# Patient Record
Sex: Male | Born: 1980 | Race: White | Hispanic: No | Marital: Single | Smoking: Current every day smoker
Health system: Southern US, Academic
[De-identification: ages and names within clinical notes are randomized; demographics above are authoritative.]

## PROBLEM LIST (undated history)

## (undated) DIAGNOSIS — F4310 Post-traumatic stress disorder, unspecified: Secondary | ICD-10-CM

## (undated) DIAGNOSIS — J45909 Unspecified asthma, uncomplicated: Secondary | ICD-10-CM

## (undated) DIAGNOSIS — F419 Anxiety disorder, unspecified: Secondary | ICD-10-CM

---

## 2020-01-02 ENCOUNTER — Encounter (HOSPITAL_COMMUNITY): Payer: Self-pay | Admitting: Emergency Medicine

## 2020-01-02 ENCOUNTER — Other Ambulatory Visit: Payer: Self-pay

## 2020-01-02 DIAGNOSIS — R1013 Epigastric pain: Secondary | ICD-10-CM | POA: Insufficient documentation

## 2020-01-02 DIAGNOSIS — J45909 Unspecified asthma, uncomplicated: Secondary | ICD-10-CM | POA: Insufficient documentation

## 2020-01-02 DIAGNOSIS — F419 Anxiety disorder, unspecified: Secondary | ICD-10-CM | POA: Diagnosis not present

## 2020-01-02 DIAGNOSIS — R0981 Nasal congestion: Secondary | ICD-10-CM | POA: Diagnosis not present

## 2020-01-02 DIAGNOSIS — R1011 Right upper quadrant pain: Secondary | ICD-10-CM | POA: Diagnosis not present

## 2020-01-02 DIAGNOSIS — R0602 Shortness of breath: Secondary | ICD-10-CM | POA: Insufficient documentation

## 2020-01-02 DIAGNOSIS — F410 Panic disorder [episodic paroxysmal anxiety] without agoraphobia: Secondary | ICD-10-CM | POA: Diagnosis present

## 2020-01-02 DIAGNOSIS — R55 Syncope and collapse: Secondary | ICD-10-CM | POA: Insufficient documentation

## 2020-01-02 DIAGNOSIS — R0789 Other chest pain: Secondary | ICD-10-CM | POA: Insufficient documentation

## 2020-01-02 NOTE — ED Triage Notes (Signed)
Pt states that he had a panic attack while in the dorm and thinks he may have passed out. Pt states he has history of the same. Pt has several stressors at this time, [facility] sent him to get something prescribed for his anxiety.

## 2020-01-03 ENCOUNTER — Emergency Department (HOSPITAL_COMMUNITY)
Admission: EM | Admit: 2020-01-03 | Discharge: 2020-01-03 | Disposition: A | Discharge status: Home / Self Care | Attending: Emergency Medicine | Admitting: Emergency Medicine

## 2020-01-03 ENCOUNTER — Emergency Department (HOSPITAL_COMMUNITY)

## 2020-01-03 DIAGNOSIS — F419 Anxiety disorder, unspecified: Secondary | ICD-10-CM

## 2020-01-03 DIAGNOSIS — R55 Syncope and collapse: Secondary | ICD-10-CM

## 2020-01-03 HISTORY — DX: Post-traumatic stress disorder, unspecified: F43.10

## 2020-01-03 HISTORY — DX: Anxiety disorder, unspecified: F41.9

## 2020-01-03 HISTORY — DX: Unspecified asthma, uncomplicated: J45.909

## 2020-01-03 LAB — TROPONIN I (HIGH SENSITIVITY)
Troponin I (High Sensitivity): 9 ng/L (ref <18)
Troponin I (High Sensitivity): 9 ng/L (ref <18)

## 2020-01-03 LAB — COMPREHENSIVE METABOLIC PANEL
ALT: 28 U/L (ref 0–44)
AST: 25 U/L (ref 15–41)
Albumin: 4.9 g/dL (ref 3.5–5.0)
Alkaline Phosphatase: 54 U/L (ref 38–126)
Anion gap: 12 (ref 5–15)
BUN: 17 mg/dL (ref 6–20)
CO2: 26 mmol/L (ref 22–32)
Calcium: 9.9 mg/dL (ref 8.9–10.3)
Chloride: 103 mmol/L (ref 98–111)
Creatinine, Ser: 0.79 mg/dL (ref 0.61–1.24)
GFR calc Af Amer: >60 mL/min (ref >60)
GFR calc non Af Amer: >60 mL/min (ref >60)
Glucose, Bld: 99 mg/dL (ref 70–99)
Potassium: 4.1 mmol/L (ref 3.5–5.1)
Sodium: 141 mmol/L (ref 135–145)
Total Bilirubin: 0.6 mg/dL (ref 0.3–1.2)
Total Protein: 8.6 g/dL — ABNORMAL HIGH (ref 6.5–8.1)

## 2020-01-03 LAB — CBC WITH DIFFERENTIAL/PLATELET
Abs Immature Granulocytes: 0.04 10*3/uL (ref 0.00–0.07)
Basophils Absolute: 0 10*3/uL (ref 0.0–0.1)
Basophils Relative: 1 %
Eosinophils Absolute: 0.5 10*3/uL (ref 0.0–0.5)
Eosinophils Relative: 6 %
HCT: 44.1 % (ref 39.0–52.0)
Hemoglobin: 14.6 g/dL (ref 13.0–17.0)
Immature Granulocytes: 1 %
Lymphocytes Relative: 35 %
Lymphs Abs: 2.7 10*3/uL (ref 0.7–4.0)
MCH: 30.7 pg (ref 26.0–34.0)
MCHC: 33.1 g/dL (ref 30.0–36.0)
MCV: 92.6 fL (ref 80.0–100.0)
Monocytes Absolute: 0.7 10*3/uL (ref 0.1–1.0)
Monocytes Relative: 9 %
Neutro Abs: 3.7 10*3/uL (ref 1.7–7.7)
Neutrophils Relative %: 48 %
Platelets: 234 10*3/uL (ref 150–400)
RBC: 4.76 MIL/uL (ref 4.22–5.81)
RDW: 12.8 % (ref 11.5–15.5)
WBC: 7.6 10*3/uL (ref 4.0–10.5)
nRBC: 0 % (ref 0.0–0.2)

## 2020-01-03 LAB — D-DIMER, QUANTITATIVE: D-Dimer, Quant: 0.34 ug/mL-FEU (ref 0.00–0.50)

## 2020-01-03 LAB — LIPASE, BLOOD: Lipase: 25 U/L (ref 11–51)

## 2020-01-03 MED ORDER — HYDROXYZINE HCL 25 MG PO TABS
25.0000 mg | ORAL_TABLET | Freq: Once | ORAL | Status: AC
  Administered 2020-01-03: 25 mg via ORAL
  Filled 2020-01-03: qty 1

## 2020-01-03 MED ORDER — HYDROXYZINE HCL 25 MG PO TABS: 25.0000 mg | ORAL_TABLET | Freq: Three times a day (TID) | ORAL | 0 refills | Status: AC | PRN

## 2020-01-03 NOTE — ED Provider Notes (Signed)
[facility] EMERGENCY DEPARTMENT Provider Note   CSN: [id number] Arrival date & time: 01/02/20  2330     History Chief Complaint  Patient presents with  . Panic Attack    Lance Guerrero is a 39 y.o. male.  Patient from present.  States he has a history of anxiety, asthma and PTSD.  He used to take Cymbalta and BuSpar but has not had them since he became locked up in May.  States he lives in a dorm where it is very hot.  Patient states he had trouble breathing today because the air was "thick".  He tries to use his inhaler without relief.  He states he became panicked and hyperventilated.  He has had chest tightness and shortness of breath for the past 3 days.  He got off the top bunk and went down to his locker.  He states he had a sit down and saw stars and then hit his head against a brick wall.  Thinks he lost consciousness.  States he has had issues with several stressors in the past.  Has not had his medication since May.  Denies any drug or alcohol use.  Denies any cardiac history.  Does not think he is passed out before.  Uses inhaler at home without relief.  No cough or fever.  He has a mild headache now.  Denies sudden worsening of the headache before he passed out.  No focal weakness, numbness or tingling.  Additionally he has had a right upper quadrant abdominal pain for the past several days it has been constant.  No vomiting or fever.  No change in bowel habits.  No pain with urination or blood in the urine. No previous abdominal surgery  The history is provided by the patient.       Past Medical History:  Diagnosis Date  . Anxiety   . Asthma   . PTSD (post-traumatic stress disorder)     There are no problems to display for this patient.   History reviewed. No pertinent surgical history.     History reviewed. No pertinent family history.  Social History   Tobacco Use  . Smoking status: Never Smoker  . Smokeless tobacco: Never Used  Substance Use Topics    . Alcohol use: Not Currently  . Drug use: Never    Home Medications Prior to Admission medications   Not on File    Allergies    Patient has no known allergies.  Review of Systems   Review of Systems  Constitutional: Negative for activity change, appetite change, fatigue and fever.  HENT: Positive for congestion. Negative for rhinorrhea.   Eyes: Negative for visual disturbance.  Respiratory: Positive for chest tightness and shortness of breath. Negative for cough.   Cardiovascular: Positive for chest pain.  Gastrointestinal: Positive for abdominal pain. Negative for diarrhea and nausea.  Genitourinary: Negative for dysuria and hematuria.  Musculoskeletal: Negative for arthralgias and myalgias.  Skin: Negative for rash.  Neurological: Negative for dizziness, weakness and headaches.   all other systems are negative except as noted in the HPI and PMH.    Physical Exam Updated Vital Signs BP (!) 153/111   Pulse 71   Temp 97.9 F (36.6 C)   Resp 18   Ht 5\' 7"  (1.702 m)   Wt 73 kg   SpO2 100%   BMI 25.22 kg/m   Physical Exam Vitals and nursing note reviewed.  Constitutional:      General: He is not in acute  distress.    Appearance: He is well-developed.     Comments: Anxious appearing  HENT:     Head: Normocephalic.     Comments: Small hematoma to right occiput    Mouth/Throat:     Pharynx: No oropharyngeal exudate.  Eyes:     Conjunctiva/sclera: Conjunctivae normal.     Pupils: Pupils are equal, round, and reactive to light.  Neck:     Comments: No meningismus. Cardiovascular:     Rate and Rhythm: Normal rate and regular rhythm.     Heart sounds: Normal heart sounds. No murmur heard.   Pulmonary:     Effort: Pulmonary effort is normal. No respiratory distress.     Breath sounds: Normal breath sounds.  Chest:     Chest wall: No tenderness.  Abdominal:     Palpations: Abdomen is soft.     Tenderness: There is abdominal tenderness. There is no guarding or  rebound.     Comments: Right upper quadrant and epigastric tenderness, no guarding or rebound  Musculoskeletal:        General: No tenderness. Normal range of motion.     Cervical back: Normal range of motion and neck supple.  Skin:    General: Skin is warm.  Neurological:     General: No focal deficit present.     Mental Status: He is alert and oriented to person, place, and time. Mental status is at baseline.     Cranial Nerves: No cranial nerve deficit.     Motor: No abnormal muscle tone.     Coordination: Coordination normal.     Comments: No ataxia on finger to nose bilaterally. No pronator drift. 5/5 strength throughout. CN 2-12 intact.Equal grip strength. Sensation intact.   Psychiatric:        Behavior: Behavior normal.     ED Results / Procedures / Treatments   Labs (all labs ordered are listed, but only abnormal results are displayed) Labs Reviewed  COMPREHENSIVE METABOLIC PANEL - Abnormal; Notable for the following components:      Result Value   Total Protein 8.6 (*)    All other components within normal limits  CBC WITH DIFFERENTIAL/PLATELET  LIPASE, BLOOD  D-DIMER, QUANTITATIVE (NOT AT [facility])  URINALYSIS, ROUTINE W REFLEX MICROSCOPIC  TROPONIN I (HIGH SENSITIVITY)  TROPONIN I (HIGH SENSITIVITY)    EKG EKG Interpretation  Date/Time:  Saturday January 03 2020 04:17:47 EDT Ventricular Rate:  68 PR Interval:  142 QRS Duration: 90 QT Interval:  400 QTC Calculation: 425 R Axis:   49 Text Interpretation: Normal sinus rhythm Normal ECG No previous ECGs available Confirmed by Glynn Octave [phone]) on 01/03/2020 4:23:31 AM   Radiology DG Chest 2 View  Result Date: 01/03/2020 CLINICAL DATA:  Chest pain and possible recent panic attack EXAM: CHEST - 2 VIEW COMPARISON:  None. FINDINGS: The heart size and mediastinal contours are within normal limits. Both lungs are clear. The visualized skeletal structures are unremarkable. IMPRESSION: No active cardiopulmonary  disease. Electronically Signed   By: Alcide Clever M.D.   On: 01/03/2020 05:06   CT Head Wo Contrast  Result Date: 01/03/2020 CLINICAL DATA:  Possible syncopal episode EXAM: CT HEAD WITHOUT CONTRAST TECHNIQUE: Contiguous axial images were obtained from the base of the skull through the vertex without intravenous contrast. COMPARISON:  None. FINDINGS: Brain: No evidence of acute infarction, hemorrhage, hydrocephalus, extra-axial collection or mass lesion/mass effect. Vascular: No hyperdense vessel or unexpected calcification. Skull: Normal. Negative for fracture or focal lesion. Sinuses/Orbits: No  acute finding. Other: None. IMPRESSION: Normal head CT for age Electronically Signed   By: Alcide Clever M.D.   On: 01/03/2020 06:03    Procedures Procedures (including critical care time)  Medications Ordered in ED Medications  hydrOXYzine (ATARAX/VISTARIL) tablet 25 mg (has no administration in time range)    ED Course  I have reviewed the triage vital signs and the nursing notes.  Pertinent labs & imaging results that were available during my care of the patient were reviewed by me and considered in my medical decision making (see chart for details).    MDM Rules/Calculators/A&P                          Episode of shortness of breath followed by panic attack and hyperventilation with possible syncope.  Ongoing chest pain shortness of breath for the past 3 days.  EKG is sinus rhythm.  No Brugada, no prolonged QT.  Neurological exam is nonfocal.  Work-up is reassuring.  CT head is negative.  Low suspicion for subarachnoid hemorrhage, meningitis, temporal arteritis. LFTs, lipase normal.  Troponin and D-dimer negative.  Chest x-ray is negative.  Suspect syncope in setting of hyperventilation.  No evidence of ACS or pulmonary embolism.  Patient requesting anxiety medication is given Atarax.  Has not had his BuSpar since May.  Patient continues to complain of right upper quadrant abdominal pain  and now has right flank pain after laying on the CT table.  His LFTs and lipase are normal.  Ultrasound is not available.  Will obtain CT imaging to evaluate gallbladder as well as presence of kidney stone.  Care to be transferred at shift change.  Dr. Estell Harpin to assume care. Final Clinical Impression(s) / ED Diagnoses Final diagnoses:  Anxiety  Syncope, unspecified syncope type    Rx / DC Orders ED Discharge Orders    None       Priscila Bean, Jeannett Senior, MD 01/03/20 [phone]

## 2020-01-03 NOTE — Discharge Instructions (Signed)
Your testing is negative for heart attack, blood clot in the lung, kidney stone or gallstone bladder problem.  Follow-up with your primary doctor.  Take anxiety medication as prescribed.  Return to the ED with new or worsening symptoms.

## 2021-05-09 IMAGING — CT CT HEAD W/O CM
3 series · 16 of 47 positions shown, 19 images · non-contrast
Comparison: None.

CLINICAL DATA: Possible syncopal episode

EXAM:
CT HEAD WITHOUT CONTRAST
TECHNIQUE: Contiguous axial images were obtained from the base of the skull
through the vertex without intravenous contrast.

[Series 2: head w o · axial · 0.44mm/px · z∈[-23,+107]mm · 10 of 32 slices shown, 13 images]
[im 3/32  brain]
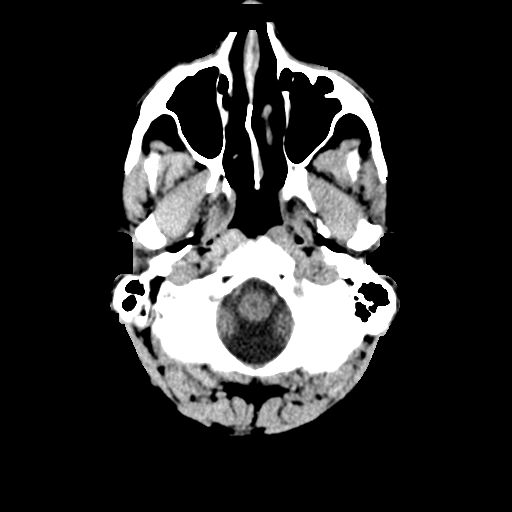
[im 3/32  bone]
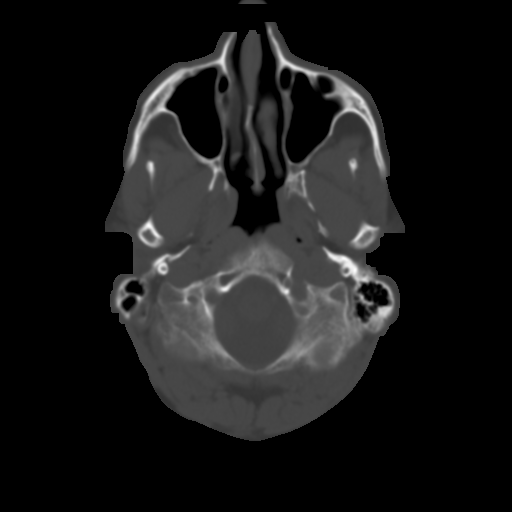
[im 6/32  brain]
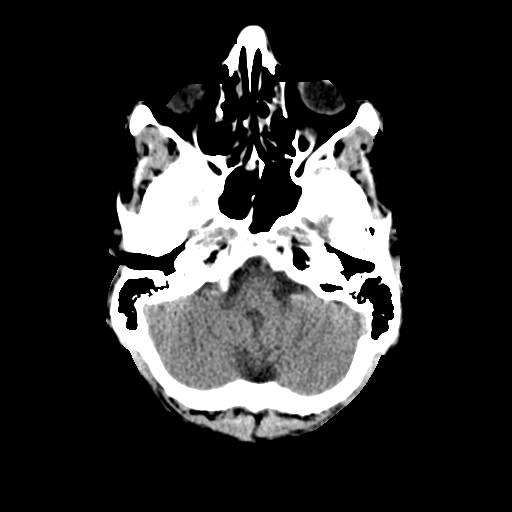
[im 9/32  brain]
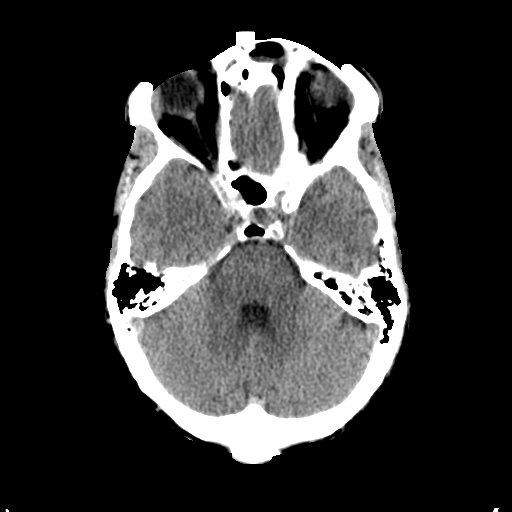
[im 11/32  brain]
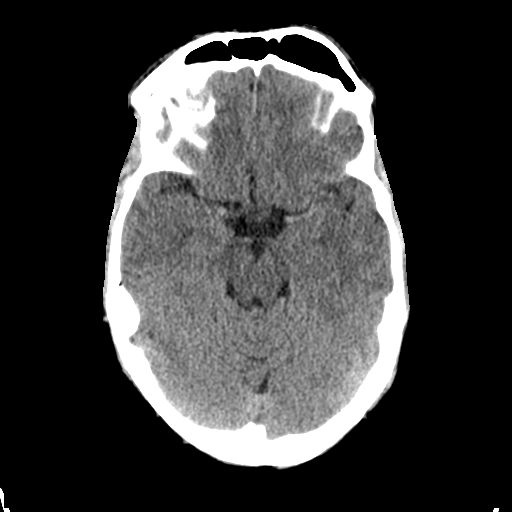
[im 14/32  brain]
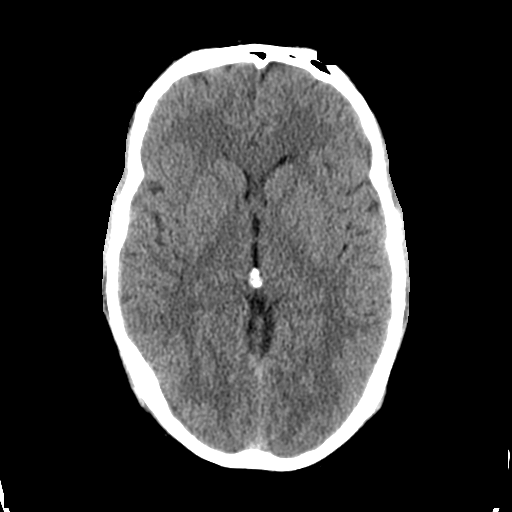
[im 14/32  bone]
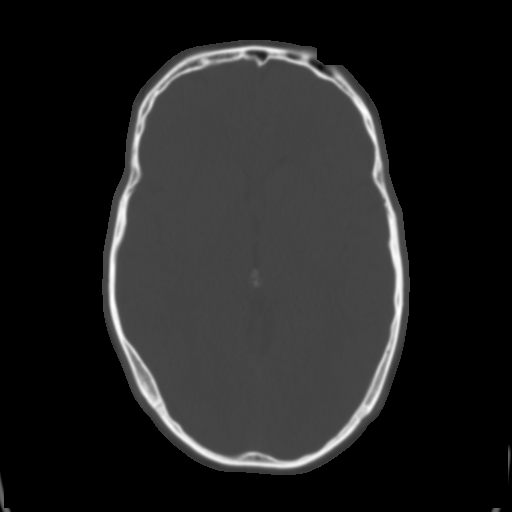
[im 18/32  brain]
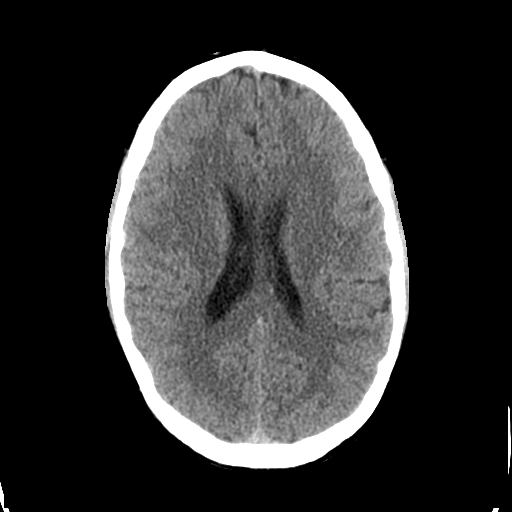
[im 21/32  brain]
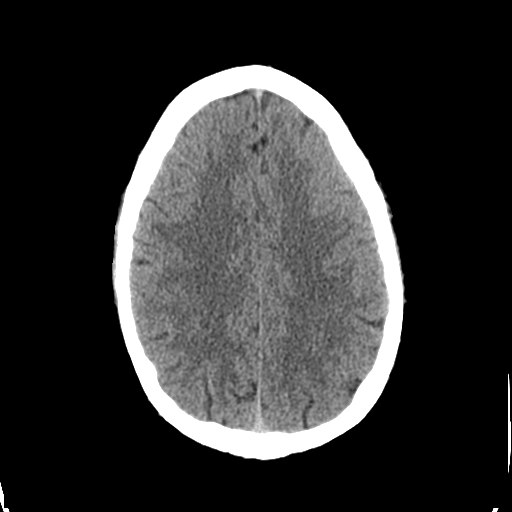
[im 24/32  brain]
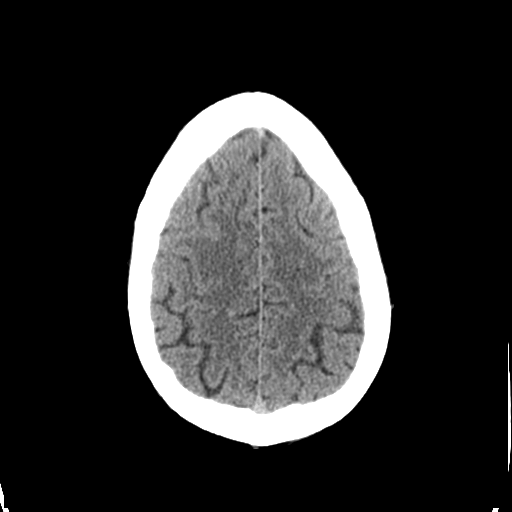
[im 26/32  brain]
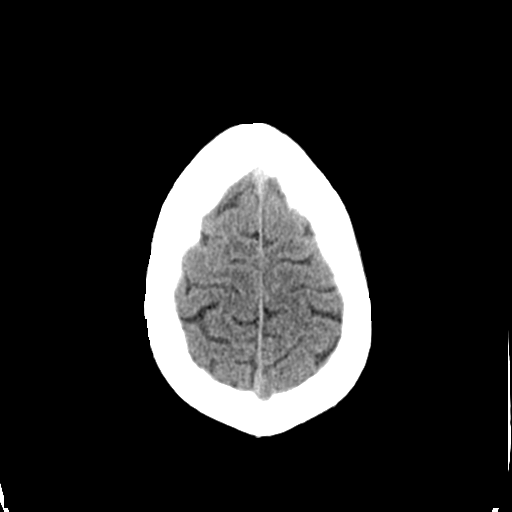
[im 26/32  bone]
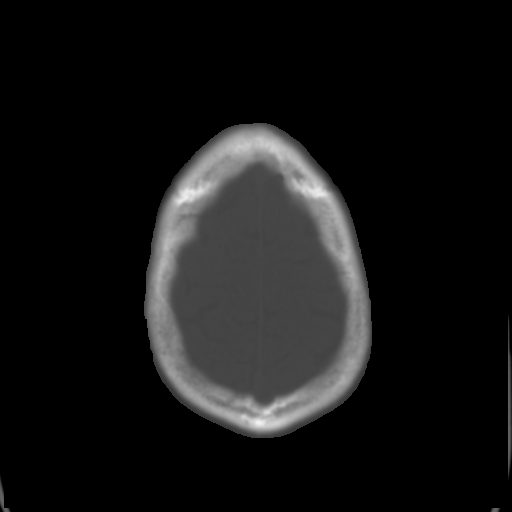
[im 29/32  brain]
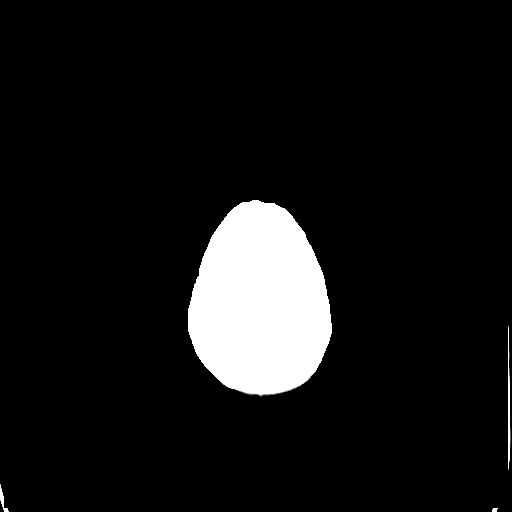

[Series 4: coronal soft · coronal · 0.36mm/px · 3 of 78 slices shown]
[im 26/78  brain]
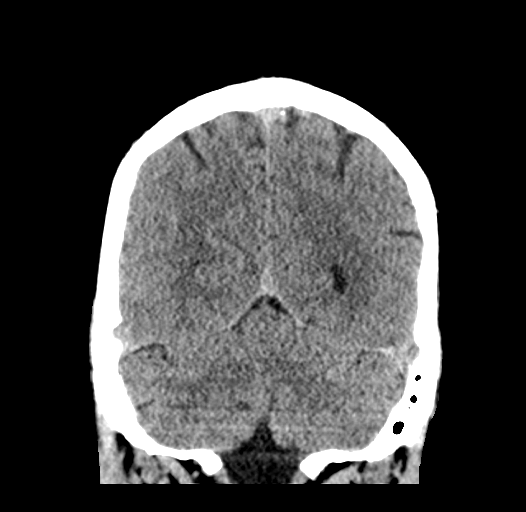
[im 35/78  brain]
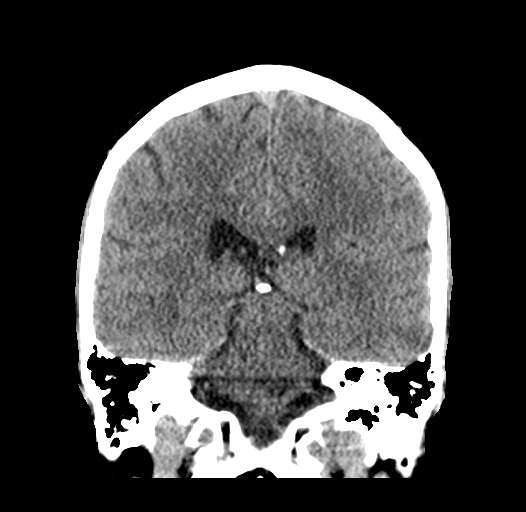
[im 43/78  brain]
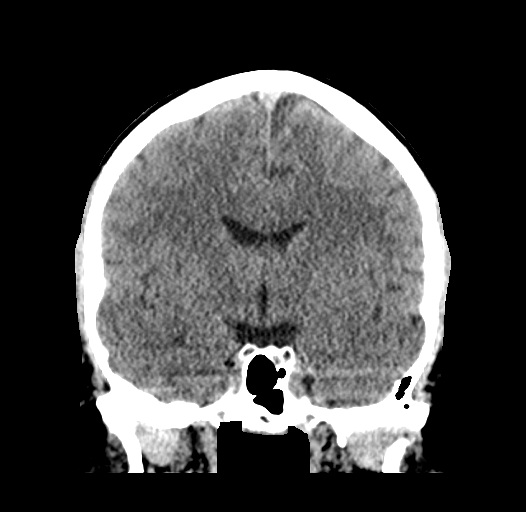

[Series 5: sagittal soft · sagittal · 0.35mm/px · 3 of 62 slices shown]
[im 21/62  brain]
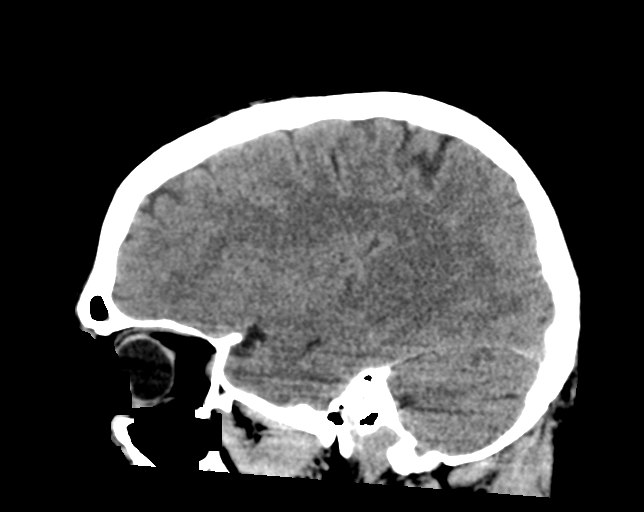
[im 31/62  brain]
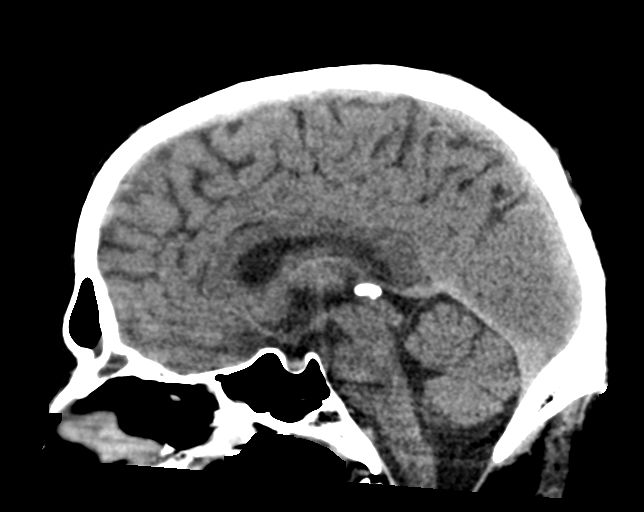
[im 41/62  brain]
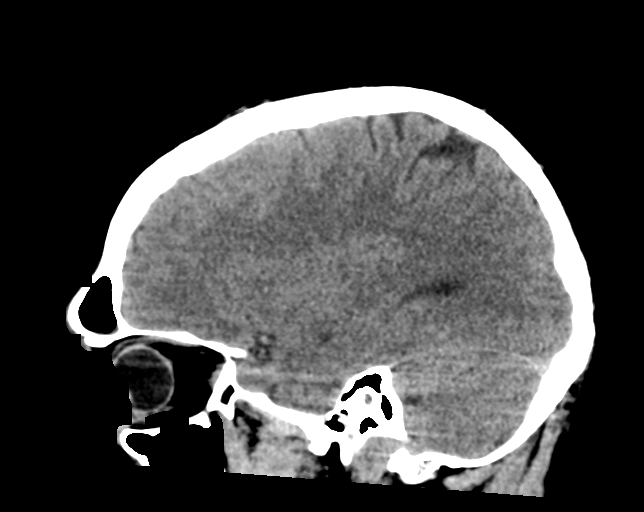

[16 of 47 positions shown; findings below may reference images not displayed]

FINDINGS: Brain: No evidence of acute infarction, hemorrhage, hydrocephalus,
extra-axial collection or mass lesion/mass effect.

Vascular: No hyperdense vessel or unexpected calcification.

Skull: Normal. Negative for fracture or focal lesion.

Sinuses/Orbits: No acute finding.

Other: None.
IMPRESSION: Normal head CT for age

## 2022-02-12 ENCOUNTER — Other Ambulatory Visit: Payer: Self-pay

## 2022-02-12 ENCOUNTER — Emergency Department
Admission: EM | Admit: 2022-02-12 | Discharge: 2022-02-12 | Disposition: A | Discharge status: Other Acute Inpatient Hospital | Payer: 59 | Attending: Family Medicine | Admitting: Family Medicine

## 2022-02-12 ENCOUNTER — Encounter (HOSPITAL_COMMUNITY): Payer: Self-pay

## 2022-02-12 DIAGNOSIS — T194XXA Foreign body in penis, initial encounter: Secondary | ICD-10-CM | POA: Insufficient documentation

## 2022-02-12 DIAGNOSIS — Z720 Tobacco use: Secondary | ICD-10-CM | POA: Insufficient documentation

## 2022-02-12 MED ORDER — LIDOCAINE (PF) 20 MG/ML (2 %) INJECTION SOLUTION
10.0000 mL | INTRAMUSCULAR | Status: AC
Start: 2022-02-12 — End: 2022-02-12
  Administered 2022-02-12: 200 mg via INTRADERMAL
  Filled 2022-02-12: qty 10

## 2022-02-12 MED ORDER — FENTANYL (PF) 50 MCG/ML INJECTION WRAPPER
50.0000 ug | INJECTION | INTRAMUSCULAR | Status: AC
Start: 2022-02-12 — End: 2022-02-12
  Administered 2022-02-12: 50 ug via INTRAVENOUS
  Filled 2022-02-12: qty 1

## 2022-02-12 MED ORDER — ONDANSETRON HCL (PF) 4 MG/2 ML INJECTION SOLUTION
4.0000 mg | INTRAMUSCULAR | Status: AC
Start: 2022-02-12 — End: 2022-02-12
  Administered 2022-02-12: 4 mg via INTRAVENOUS
  Filled 2022-02-12: qty 2

## 2022-02-12 MED ORDER — FENTANYL (PF) 50 MCG/ML INJECTION WRAPPER
INJECTION | INTRAMUSCULAR | Status: AC
Start: 2022-02-12 — End: 2022-02-12
  Administered 2022-02-12: 50 ug via INTRAVENOUS
  Filled 2022-02-12: qty 1

## 2022-02-12 MED ORDER — FENTANYL (PF) 50 MCG/ML INJECTION WRAPPER
50.0000 ug | INJECTION | INTRAMUSCULAR | Status: AC
Start: 2022-02-12 — End: 2022-02-12

## 2022-02-12 NOTE — ED Nurses Note (Signed)
Report given to [facility] at [facility].   Report given to health team.   Patient being loaded to stretcher.

## 2022-02-12 NOTE — ED Triage Notes (Signed)
Presents to ED via [facility] with c/o a wedding ring stuck around his penis. Patient states he placed the wedding ring around the penis around 2am this morning. Penis is edematous and dusky in color.

## 2022-02-12 NOTE — ED Nurses Note (Signed)
Patient en route to [facility].

## 2022-02-12 NOTE — Procedures (Signed)
I did a dorsal penile nerve block.  We attempted to remove the ring around the penis with a ring cutter and were unsuccessful.  We then tried a penis drainage by putting a needle in the corpus cavernosa.  This is only resulted in drainage of about 10 cc which was not enough to do the job.  We are going to have transfer this patient to Urology at [facility].

## 2022-02-12 NOTE — ED Nurses Note (Signed)
Multiple ring cutting attempts, 2 broken ring blades, unsuccessful. Dr. Given attempted to decompress the penis, unsuccessful. Patient to be transferred to [facility] ER.

## 2022-02-12 NOTE — ED Provider Notes (Signed)
Department of Emergency Medicine  [facility]  02/12/2022        Patient is a 41 y.o.  male presenting to the ED with chief complaint of wedding ring stuck on penis.     Location:  Base of penis  Quality:  Painful  Onset:  2:00 a.m. today  Severity:  Severe  Timing:  Still present  Context:  He has ED trouble and he thought it would be a good idea to use the ring to help his erection  Modifying factors:  None  Associated symptoms:  None    Review of Systems:    Constitutional: No fever, chills  Skin: No rashes or lesions  MSK: No joint pain.  No neck or back pain  Neuro: No numbness, tingling, or weakness.  Psych: No SI or HI. Normal mood    Past Medical History:  No past medical history on file.    Past Surgical History:  No past surgical history on file.    Social History:  Social History     Tobacco Use    Smoking status: Every Day     Types: Cigarettes    Smokeless tobacco: Never   Substance Use Topics    Alcohol use: Never    Drug use: Never     Social History     Substance and Sexual Activity   Drug Use Never       Family History:  No family history on file.    Allergies and old records reviewed    Filed Vitals:    02/12/22 1050 02/12/22 1052   BP:  (!) 174/124   Pulse: (!) 117    Resp: 20    Temp: 36.7 C (98 F)    SpO2: 98%        Physical Exam:     Nursing note and vitals reviewed.  Vital signs reviewed as above.  Moderate acute distress.   Constitutional: Pt is well-developed and well-nourished.   Head: Normocephalic and atraumatic.   Eyes: Conjunctivae are normal. Pupils are equal, round, and reactive to light. EOM are intact  Neck: Soft, supple, full range of motion.  Pulmonary/Chest: No respiratory distress.  Abdomen is normal .  penis is grossly swollen with a ring stuck at the base of the penis.  Musculoskeletal: Normal range of motion. No deformities.  Neurological: CNs 2-12 grossly intact.  No focal deficits noted.  Skin: No rash or lesions  Psychiatric: Patient has a normal  mood and affect.     Workup:     Labs:  No results found for this or any previous visit (from the past 24 hour(s)).    Imaging:         Abnormal Lab results:  Labs Ordered/Reviewed - No data to display      Plan: Appropriate labs and imaging ordered. Medical Records reviewed.    MDM:   During the patient's stay in the emergency department, the above listed imaging and/or labs were performed to assist with medical decision making and were reviewed by myself when available for review.     Orders Placed This Encounter    ondansetron (ZOFRAN) 2 mg/mL injection    fentaNYL (SUBLIMAZE) 50 mcg/mL injection    lidocaine PF (XYLOCAINE-MPF) 2% injection    fentaNYL (SUBLIMAZE) 50 mcg/mL injection    fentaNYL (PF) (SUBLIMAZE) 50 mcg/mL injection ---Cabinet Override         Pt remained stable throughout the emergency department course.  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  Her ring cutters her failing.  It must be a hard material and also I was not very successful in attempting to drain his penis I got a bout 10 cc out of his penis.    Critical care time: 34min

## 2023-01-23 ENCOUNTER — Ambulatory Visit
Admission: RE | Admit: 2023-01-23 | Discharge: 2023-01-23 | Disposition: A | Discharge status: Home / Self Care | Payer: Medicaid Other | Source: Ambulatory Visit | Attending: Student in an Organized Health Care Education/Training Program | Admitting: Student in an Organized Health Care Education/Training Program

## 2023-01-23 ENCOUNTER — Other Ambulatory Visit (HOSPITAL_COMMUNITY): Payer: Self-pay | Admitting: Student in an Organized Health Care Education/Training Program

## 2023-01-23 DIAGNOSIS — R300 Dysuria: Secondary | ICD-10-CM

## 2023-01-23 DIAGNOSIS — N200 Calculus of kidney: Secondary | ICD-10-CM

## 2023-01-23 LAB — CBC WITH DIFF
BASOPHIL #: <0.1 10*3/uL (ref <=0.20)
BASOPHIL %: 1 %
EOSINOPHIL #: 0.46 10*3/uL (ref <=0.50)
EOSINOPHIL %: 6 %
HCT: 40 % (ref 38.9–52.0)
HGB: 13.4 g/dL (ref 13.4–17.5)
IMMATURE GRANULOCYTE #: <0.1 10*3/uL (ref <0.10)
IMMATURE GRANULOCYTE %: 0 % (ref 0.0–1.0)
LYMPHOCYTE #: 2.85 10*3/uL (ref 1.00–4.80)
LYMPHOCYTE %: 37 %
MCH: 30 pg (ref 26.0–32.0)
MCHC: 33.5 g/dL (ref 31.0–35.5)
MCV: 89.7 fL (ref 78.0–100.0)
MONOCYTE #: 0.57 10*3/uL (ref 0.20–1.10)
MONOCYTE %: 8 %
MPV: 10.2 fL (ref 8.7–12.5)
NEUTROPHIL #: 3.68 10*3/uL (ref 1.50–7.70)
NEUTROPHIL %: 48 %
PLATELETS: 264 10*3/uL (ref 150–400)
RBC: 4.46 10*6/uL — ABNORMAL LOW (ref 4.50–6.10)
RDW-CV: 13 % (ref 11.5–15.5)
WBC: 7.6 10*3/uL (ref 3.7–11.0)

## 2023-01-23 LAB — CBC/DIFF - CLIENT CONSOLIDATED
BASOPHIL #: <0.1 10*3/uL (ref <=0.20)
BASOPHIL %: 1 %
EOSINOPHIL #: 0.46 10*3/uL (ref <=0.50)
EOSINOPHIL %: 6 %
HCT: 40 % (ref 38.9–52.0)
HGB: 13.4 g/dL (ref 13.4–17.5)
IMMATURE GRANULOCYTE #: <0.1 10*3/uL (ref <0.10)
IMMATURE GRANULOCYTE %: 0 % (ref 0.0–1.0)
LYMPHOCYTE #: 2.85 10*3/uL (ref 1.00–4.80)
LYMPHOCYTE %: 37 %
MCH: 30 pg (ref 26.0–32.0)
MCHC: 33.5 g/dL (ref 31.0–35.5)
MCV: 89.7 fL (ref 78.0–100.0)
MONOCYTE #: 0.57 10*3/uL (ref 0.20–1.10)
MONOCYTE %: 8 %
MPV: 10.2 fL (ref 8.7–12.5)
NEUTROPHIL #: 3.68 10*3/uL (ref 1.50–7.70)
NEUTROPHIL %: 48 %
PLATELETS: 264 10*3/uL (ref 150–400)
RBC: 4.46 10*6/uL — ABNORMAL LOW (ref 4.50–6.10)
RDW-CV: 13 % (ref 11.5–15.5)
WBC: 7.6 10*3/uL (ref 3.7–11.0)

## 2023-01-23 LAB — COMPREHENSIVE METABOLIC PANEL, NON-FASTING
ALBUMIN: 4.2 g/dL (ref 3.5–5.0)
ALKALINE PHOSPHATASE: 68 U/L (ref 45–115)
ALT (SGPT): 16 U/L (ref 10–55)
ANION GAP: 12 mmol/L (ref 4–13)
AST (SGOT): 24 U/L (ref 8–45)
BILIRUBIN TOTAL: 0.3 mg/dL (ref 0.3–1.3)
BUN/CREA RATIO: 19 (ref 6–22)
BUN: 16 mg/dL (ref 8–25)
CALCIUM: 9.4 mg/dL (ref 8.6–10.2)
CHLORIDE: 105 mmol/L (ref 96–111)
CO2 TOTAL: 25 mmol/L (ref 22–30)
CREATININE: 0.86 mg/dL (ref 0.75–1.35)
ESTIMATED GFR - MALE: >90 mL/min/BSA (ref >=60)
GLUCOSE: 83 mg/dL (ref 65–125)
POTASSIUM: 3.5 mmol/L (ref 3.5–5.1)
PROTEIN TOTAL: 8.1 g/dL (ref 6.4–8.3)
SODIUM: 142 mmol/L (ref 136–145)

## 2023-01-25 ENCOUNTER — Other Ambulatory Visit (HOSPITAL_COMMUNITY): Payer: Self-pay | Admitting: Student in an Organized Health Care Education/Training Program

## 2023-01-25 DIAGNOSIS — N200 Calculus of kidney: Secondary | ICD-10-CM

## 2023-01-26 LAB — URINE CULTURE: URINE CULTURE: NO GROWTH

## 2023-02-12 ENCOUNTER — Ambulatory Visit (HOSPITAL_COMMUNITY): Payer: Medicaid Other

## 2023-09-19 ENCOUNTER — Ambulatory Visit (HOSPITAL_COMMUNITY): Payer: Self-pay | Admitting: Surgery

## 2023-09-19 ENCOUNTER — Encounter (HOSPITAL_COMMUNITY): Payer: Self-pay

## 2023-09-19 ENCOUNTER — Other Ambulatory Visit: Payer: Self-pay

## 2023-10-02 ENCOUNTER — Ambulatory Visit
Admission: RE | Admit: 2023-10-02 | Discharge: 2023-10-02 | Disposition: A | Discharge status: Home / Self Care | Source: Ambulatory Visit | Attending: Student in an Organized Health Care Education/Training Program | Admitting: Student in an Organized Health Care Education/Training Program

## 2023-10-02 ENCOUNTER — Other Ambulatory Visit (HOSPITAL_COMMUNITY): Payer: Self-pay | Admitting: Student in an Organized Health Care Education/Training Program

## 2023-10-02 DIAGNOSIS — Z13220 Encounter for screening for lipoid disorders: Secondary | ICD-10-CM

## 2023-10-02 DIAGNOSIS — Z1159 Encounter for screening for other viral diseases: Secondary | ICD-10-CM | POA: Insufficient documentation

## 2023-10-02 DIAGNOSIS — Z114 Encounter for screening for human immunodeficiency virus [HIV]: Secondary | ICD-10-CM

## 2023-10-02 LAB — CBC/DIFF - CLIENT CONSOLIDATED
BASOPHIL #: <0.1 10*3/uL (ref <=0.20)
BASOPHIL %: 0.3 %
EOSINOPHIL #: 0.21 10*3/uL (ref <=0.50)
EOSINOPHIL %: 2.9 %
HCT: 44.5 % (ref 38.9–52.0)
HGB: 14.6 g/dL (ref 13.4–17.5)
IMMATURE GRANULOCYTE #: <0.1 10*3/uL (ref <0.10)
IMMATURE GRANULOCYTE %: 0.4 % (ref 0.0–1.0)
LYMPHOCYTE #: 1.81 10*3/uL (ref 1.00–4.80)
LYMPHOCYTE %: 24.6 %
MCH: 29.4 pg (ref 26.0–32.0)
MCHC: 32.8 g/dL (ref 31.0–35.5)
MCV: 89.5 fL (ref 78.0–100.0)
MONOCYTE #: 0.67 10*3/uL (ref 0.20–1.10)
MONOCYTE %: 9.1 %
MPV: 9.5 fL (ref 8.7–12.5)
NEUTROPHIL #: 4.61 10*3/uL (ref 1.50–7.70)
NEUTROPHIL %: 62.7 %
PLATELETS: 262 10*3/uL (ref 150–400)
RBC: 4.97 10*6/uL (ref 4.50–6.10)
RDW-CV: 13.6 % (ref 11.5–15.5)
WBC: 7.4 10*3/uL (ref 3.7–11.0)

## 2023-10-02 LAB — CBC WITH DIFF
BASOPHIL #: <0.1 10*3/uL (ref <=0.20)
BASOPHIL %: 0.3 %
EOSINOPHIL #: 0.21 10*3/uL (ref <=0.50)
EOSINOPHIL %: 2.9 %
HCT: 44.5 % (ref 38.9–52.0)
HGB: 14.6 g/dL (ref 13.4–17.5)
IMMATURE GRANULOCYTE #: <0.1 10*3/uL (ref <0.10)
IMMATURE GRANULOCYTE %: 0.4 % (ref 0.0–1.0)
LYMPHOCYTE #: 1.81 10*3/uL (ref 1.00–4.80)
LYMPHOCYTE %: 24.6 %
MCH: 29.4 pg (ref 26.0–32.0)
MCHC: 32.8 g/dL (ref 31.0–35.5)
MCV: 89.5 fL (ref 78.0–100.0)
MONOCYTE #: 0.67 10*3/uL (ref 0.20–1.10)
MONOCYTE %: 9.1 %
MPV: 9.5 fL (ref 8.7–12.5)
NEUTROPHIL #: 4.61 10*3/uL (ref 1.50–7.70)
NEUTROPHIL %: 62.7 %
PLATELETS: 262 10*3/uL (ref 150–400)
RBC: 4.97 10*6/uL (ref 4.50–6.10)
RDW-CV: 13.6 % (ref 11.5–15.5)
WBC: 7.4 10*3/uL (ref 3.7–11.0)

## 2023-10-02 LAB — HEPATITIS C ANTIBODY SCREEN WITH REFLEX TO HCV PCR: HCV ANTIBODY QUALITATIVE: REACTIVE — AB

## 2023-10-02 LAB — COMPREHENSIVE METABOLIC PANEL, NON-FASTING
ALBUMIN: 3.8 g/dL (ref 3.5–5.0)
ALKALINE PHOSPHATASE: 76 U/L (ref 45–115)
ALT (SGPT): 22 U/L (ref <43)
ANION GAP: 11 mmol/L (ref 4–13)
AST (SGOT): 24 U/L (ref 11–34)
BILIRUBIN TOTAL: 0.2 mg/dL — ABNORMAL LOW (ref 0.3–1.3)
BUN/CREA RATIO: 27 — ABNORMAL HIGH (ref 6–22)
BUN: 21 mg/dL (ref 8–25)
CALCIUM: 9.4 mg/dL (ref 8.6–10.2)
CHLORIDE: 108 mmol/L (ref 96–111)
CO2 TOTAL: 20 mmol/L — ABNORMAL LOW (ref 22–30)
CREATININE: 0.77 mg/dL (ref 0.75–1.35)
ESTIMATED GFR - MALE: >90 mL/min/BSA (ref >=60)
GLUCOSE: 107 mg/dL (ref 65–125)
POTASSIUM: 4.7 mmol/L (ref 3.5–5.1)
PROTEIN TOTAL: 7.6 g/dL (ref 6.0–7.9)
SODIUM: 139 mmol/L (ref 136–145)

## 2023-10-02 LAB — LIPID PANEL
CHOL/HDL RATIO: 3.8
CHOLESTEROL: 173 mg/dL (ref 100–200)
HDL CHOL: 46 mg/dL — ABNORMAL LOW (ref >=50)
LDL CALC: 100 mg/dL — ABNORMAL HIGH (ref <100)
NON-HDL: 127 mg/dL (ref <=190)
TRIGLYCERIDES: 157 mg/dL — ABNORMAL HIGH (ref <150)
VLDL CALC: 26 mg/dL (ref <30)

## 2023-10-02 LAB — HIV1/HIV2 SCREEN, COMBINED ANTIGEN AND ANTIBODY: HIV SCREEN, COMBINED ANTIGEN & ANTIBODY: NEGATIVE

## 2023-10-04 LAB — HEPATITIS C VIRUS (HCV) RNA DETECTION AND QUANTIFICATION, PCR, PLASMA: HCV QUANTITATIVE PCR: NOT DETECTED
